# Patient Record
Sex: Female | Born: 1955 | Race: Black or African American | Hispanic: No | Marital: Single | Smoking: Former smoker
Health system: Southern US, Community
[De-identification: ages and names within clinical notes are randomized; demographics above are authoritative.]

## PROBLEM LIST (undated history)

## (undated) DIAGNOSIS — I639 Cerebral infarction, unspecified: Secondary | ICD-10-CM

## (undated) DIAGNOSIS — I10 Essential (primary) hypertension: Secondary | ICD-10-CM

## (undated) DIAGNOSIS — K219 Gastro-esophageal reflux disease without esophagitis: Secondary | ICD-10-CM

## (undated) HISTORY — DX: Cerebral infarction, unspecified: I63.9

## (undated) HISTORY — DX: Gastro-esophageal reflux disease without esophagitis: K21.9

## (undated) HISTORY — DX: Essential (primary) hypertension: I10

---

## 2002-01-16 HISTORY — PX: FOOT SURGERY: SHX648

## 2002-01-16 HISTORY — PX: SKIN GRAFT: SHX250

## 2004-02-22 ENCOUNTER — Ambulatory Visit: Payer: Self-pay

## 2004-04-09 ENCOUNTER — Emergency Department: Payer: Self-pay | Admitting: General Practice

## 2007-01-17 DIAGNOSIS — I639 Cerebral infarction, unspecified: Secondary | ICD-10-CM

## 2007-01-17 HISTORY — DX: Cerebral infarction, unspecified: I63.9

## 2008-03-28 ENCOUNTER — Inpatient Hospital Stay: Payer: Self-pay | Admitting: Internal Medicine

## 2008-05-01 ENCOUNTER — Emergency Department: Payer: Self-pay | Admitting: Emergency Medicine

## 2008-06-04 ENCOUNTER — Encounter: Payer: Self-pay | Admitting: Family Medicine

## 2008-06-16 ENCOUNTER — Encounter: Payer: Self-pay | Admitting: Family Medicine

## 2008-07-16 ENCOUNTER — Encounter: Payer: Self-pay | Admitting: Family Medicine

## 2008-07-24 ENCOUNTER — Emergency Department: Payer: Self-pay | Admitting: Emergency Medicine

## 2008-08-16 ENCOUNTER — Encounter: Payer: Self-pay | Admitting: Family Medicine

## 2008-09-16 ENCOUNTER — Encounter: Payer: Self-pay | Admitting: Family Medicine

## 2008-11-23 HISTORY — PX: BREAST BIOPSY: SHX20

## 2009-09-12 IMAGING — CT CT HEAD WITHOUT CONTRAST
3 series · 18 of 30 positions shown, 20 images · non-contrast
Comparison: none

REASON FOR EXAM: slurred speech
COMMENTS:

[Series 2: without · axial · non-contrast · 0.40mm/px · z∈[-165,-45]mm · 7 of 32 slices shown, 9 images (1 of 2)]
[im 4/32  brain]
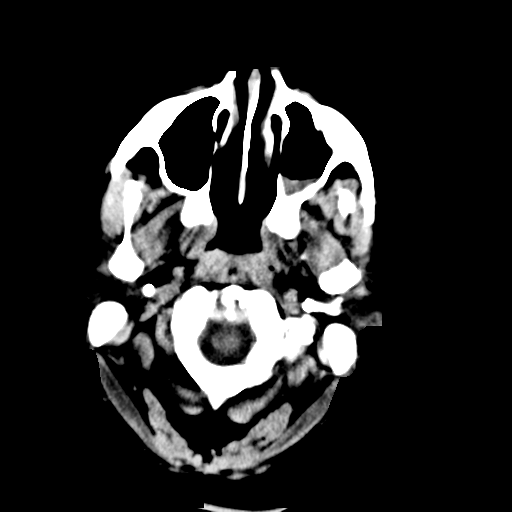
[im 4/32  bone]
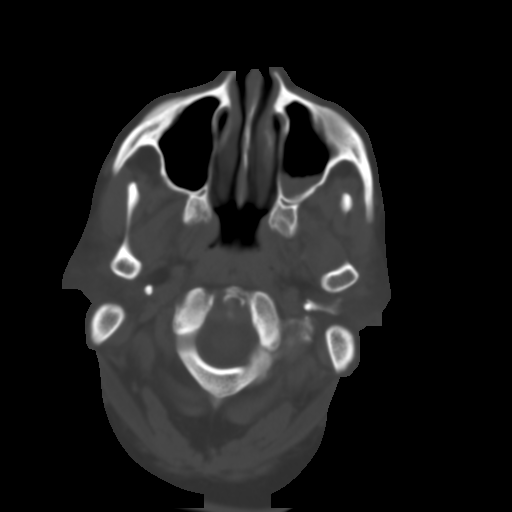
[im 8/32  brain]
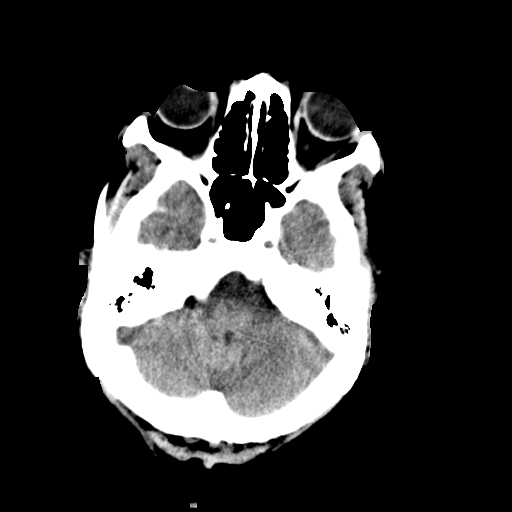
[im 12/32  brain]
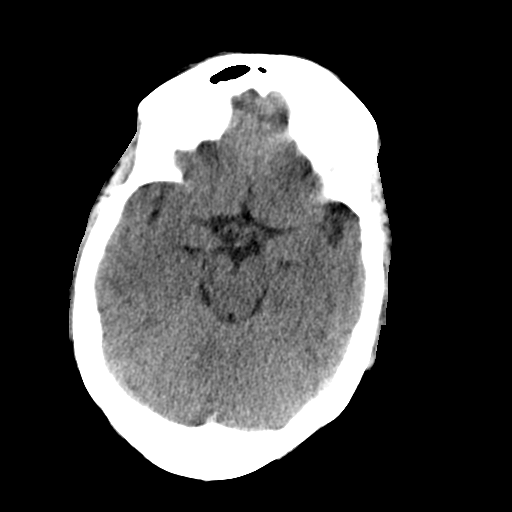
[im 16/32  brain]
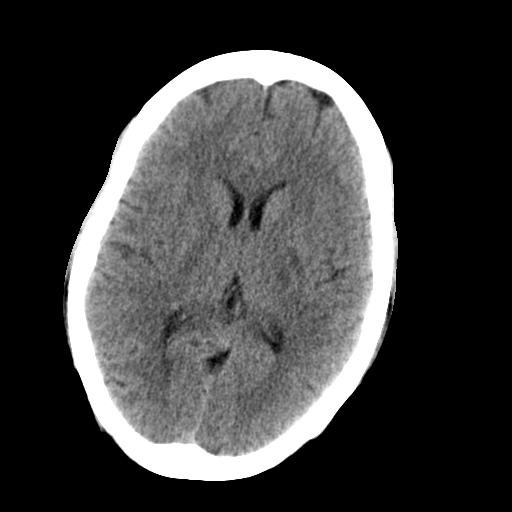
[im 20/32  brain]
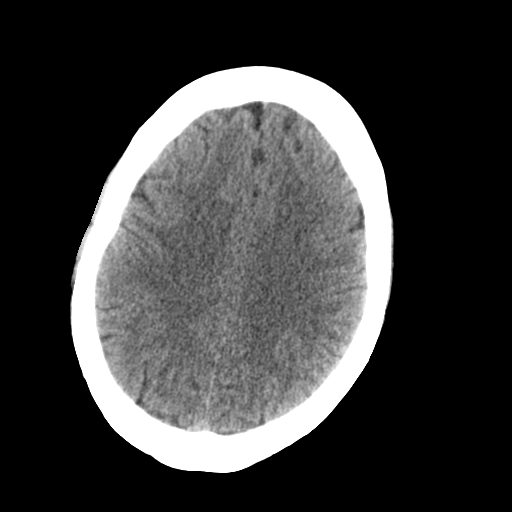
[im 20/32  bone]
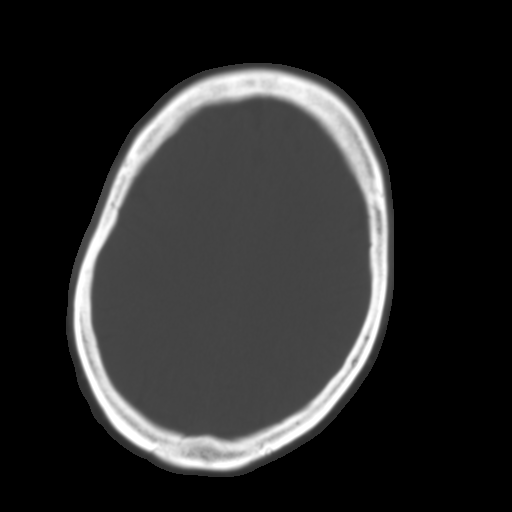
[im 24/32  brain]
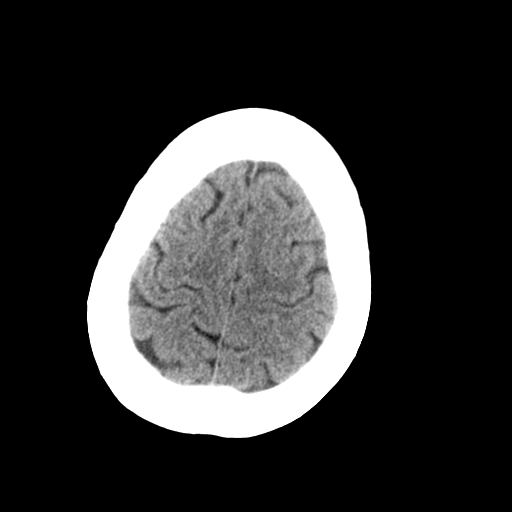
[im 28/32  brain]
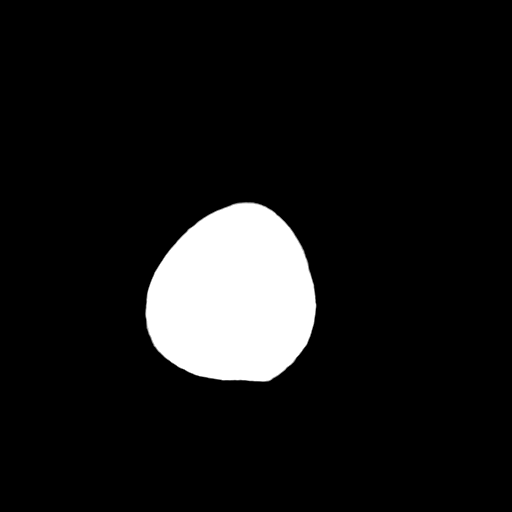

[Series 3: bone · axial · 0.40mm/px · z∈[-160,-115]mm · 3 of 32 slices shown]
[im 5/32  bone]
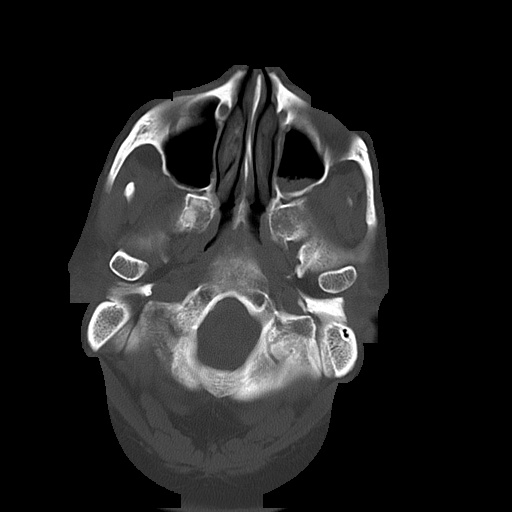
[im 9/32  bone]
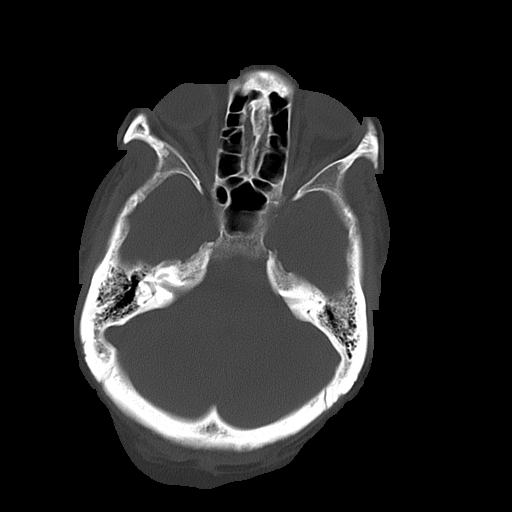
[im 14/32  bone]
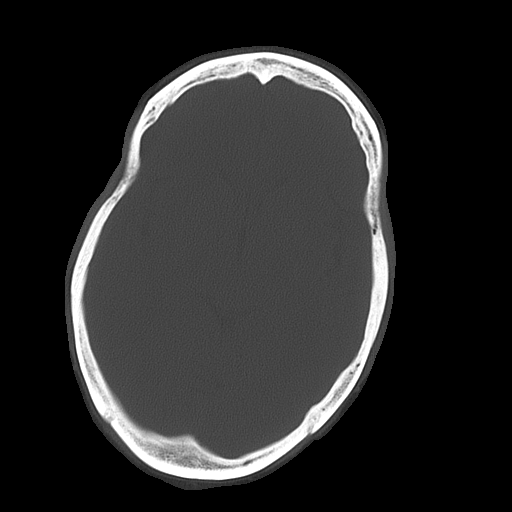

[Series 4: without · axial · non-contrast · 0.40mm/px · z∈[-154,-125]mm · 8 of 52 slices shown (2 of 2)]
[im 5/52  brain]
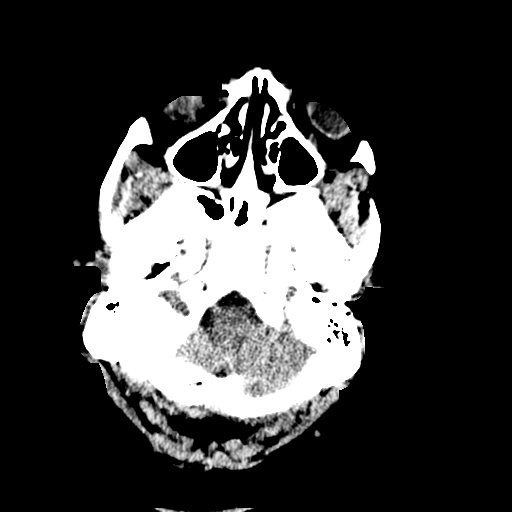
[im 13/52  brain]
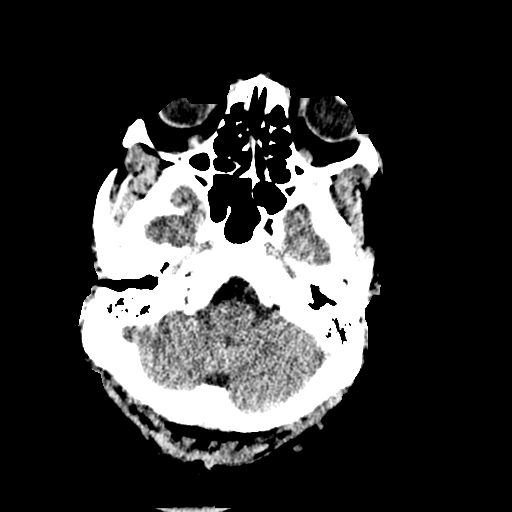
[im 18/52  brain]
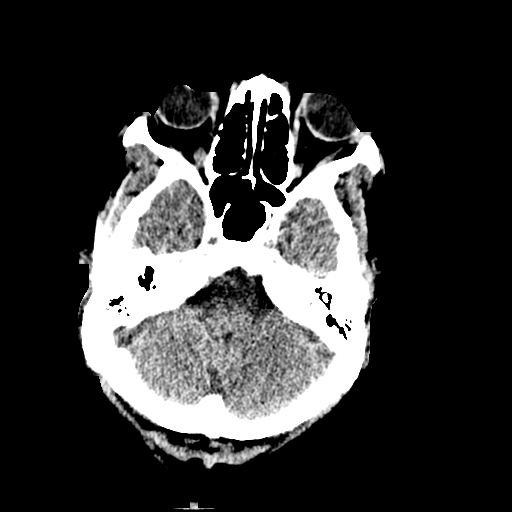
[im 22/52  brain]
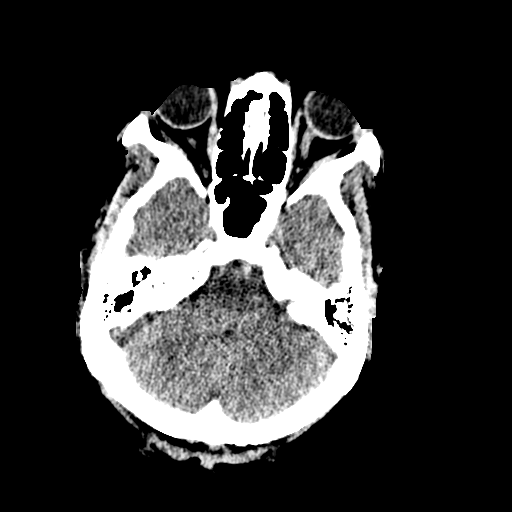
[im 30/52  brain]
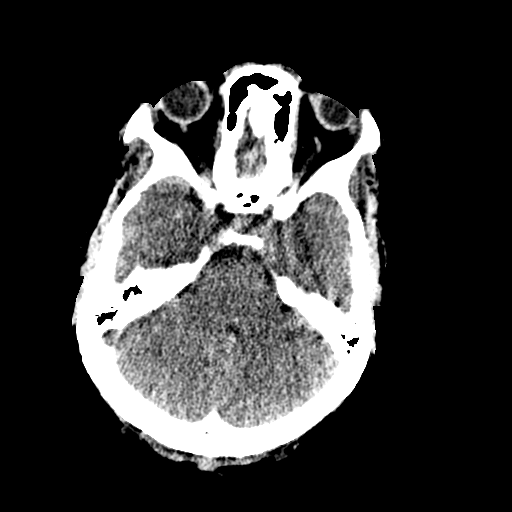
[im 35/52  brain]
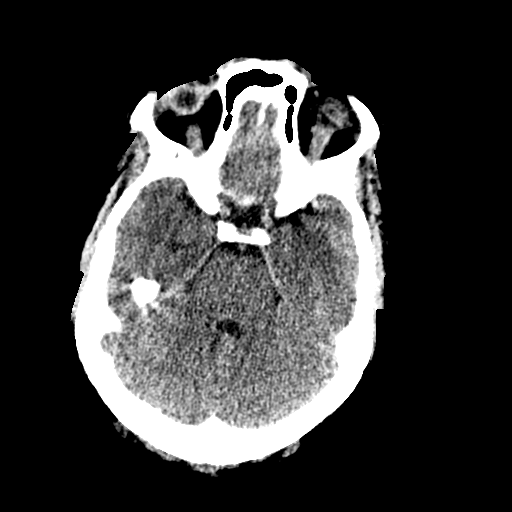
[im 39/52  brain]
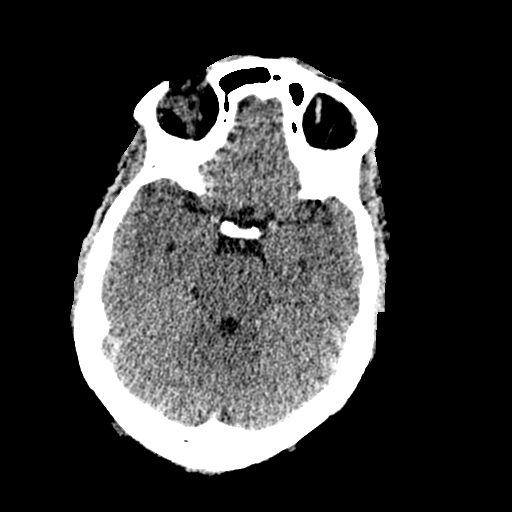
[im 47/52  brain]
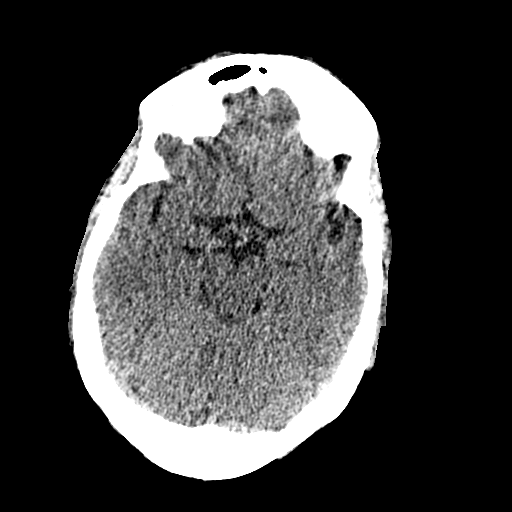

[18 of 30 positions shown; findings below may reference images not displayed]

PROCEDURE:     CT  - CT HEAD WITHOUT CONTRAST  - March 28, 2008  [DATE]

RESULT:     There is no evidence of intra-axial nor extra-axial fluid
collections. The visualized cranium demonstrates no evidence of fracture or
dislocation. An air fluid level is identified within the LEFT maxillary
sinus possibly representing an element of sinus disease.
IMPRESSION: 1. No evidence of acute intracranial abnormalities.

Dr. Engelbert X of the Emergency Department was informed of these findings at
the time of the initial interpretation.

## 2012-06-26 ENCOUNTER — Encounter: Payer: Self-pay | Admitting: General Surgery

## 2012-07-09 DIAGNOSIS — R920 Mammographic microcalcification found on diagnostic imaging of breast: Secondary | ICD-10-CM | POA: Insufficient documentation

## 2012-07-17 ENCOUNTER — Ambulatory Visit (INDEPENDENT_AMBULATORY_CARE_PROVIDER_SITE_OTHER): Payer: Medicare Other | Admitting: General Surgery

## 2012-07-17 ENCOUNTER — Encounter: Payer: Self-pay | Admitting: General Surgery

## 2012-07-17 VITALS — BP 130/72 | HR 76 | Resp 12 | Ht <= 58 in | Wt 183.0 lb

## 2012-07-17 DIAGNOSIS — R920 Mammographic microcalcification found on diagnostic imaging of breast: Secondary | ICD-10-CM

## 2012-07-17 NOTE — Patient Instructions (Addendum)
Patient to return in 6 months.

## 2012-07-17 NOTE — Progress Notes (Signed)
Patient ID: Leslie Nunez, female   DOB: March 14, 1955, 57 y.o.   MRN: [id number]  Chief Complaint  Patient presents with  . Follow-up    mammogram    HPI Leslie Nunez is a 57 y.o. female who presents for a breast evaluation. The most recent mammogram was done on 06/24/12 at [facility].Patient had an right breast stereo biopsy in 2010. Patient does perform regular self breast checks and does not get regular  mammograms done. Paternal Aunt history of breast cancer at passed at the age 33.    HPI  Past Medical History  Diagnosis Date  . Hypertension   . GERD (gastroesophageal reflux disease)   . Stroke 2009    Past Surgical History  Procedure Laterality Date  . Foot surgery Left 2004  . Skin graft Left 2004  . Breast biopsy Right 2010    stereo    Family History  Problem Relation Age of Onset  . Breast cancer Paternal Aunt     Social History History  Substance Use Topics  . Smoking status: Former Smoker -- 1.00 packs/day for 30 years  . Smokeless tobacco: Never Used  . Alcohol Use: No    Allergies  Allergen Reactions  . Morphine And Related     Current Outpatient Prescriptions  Medication Sig Dispense Refill  . amLODipine (NORVASC) 10 MG tablet Take 10 mg by mouth daily.      . hydrochlorothiazide (HYDRODIURIL) 25 MG tablet Take 25 mg by mouth daily.      . metoprolol tartrate (LOPRESSOR) 25 MG tablet Take 25 mg by mouth 2 (two) times daily.      . simvastatin (ZOCOR) 40 MG tablet Take 40 mg by mouth every evening.       No current facility-administered medications for this visit.    Review of Systems Review of Systems  Constitutional: Negative.   Cardiovascular: Negative.     Blood pressure 130/72, pulse 76, resp. rate 12, height 4\' 8"  (1.422 m), weight 183 lb (83.008 kg).  Physical Exam Physical Exam  Constitutional: She is oriented to person, place, and time. She appears well-developed and well-nourished.  Eyes: No scleral icterus.  Cardiovascular: Normal  rate, regular rhythm and normal heart sounds.   Pulmonary/Chest: Breath sounds normal. Right breast exhibits no inverted nipple, no mass, no nipple discharge, no skin change and no tenderness. Left breast exhibits no inverted nipple, no mass, no nipple discharge, no skin change and no tenderness.  Lymphadenopathy:    She has no cervical adenopathy.    She has no axillary adenopathy.  Neurological: She is alert and oriented to person, place, and time.  Skin: Skin is warm and dry.    Data Reviewed 06/24/2012 bilateral mammograms completed at [facility] showed a new small cluster of calcifications in the left breast. These had developed since her October 2010 exam. BI-RAD-4.  The previously biopsied right breast lesion is unchanged.    Assessment    New microcalcifications of the left breast.    Plan    There's been also for years since her interval mammogram was completed. There are 5 calcifications loosely clustered in the upper outer quadrant of the left breast. This is a low probability finding for DCIS without an associated mass.  The options for management were reviewed: 1) early stereotactic biopsy to establish a diagnosis of Versed 2) 6 month followup exam.  The patient at this time would like to complete a six-month followup. She was reminded that it will be very important to  have this exam completed, as she has missed followup mammograms in the past.       Earline Mayotte 07/17/2012, 11:00 AM

## 2013-01-01 ENCOUNTER — Encounter: Payer: Self-pay | Admitting: General Surgery

## 2013-01-13 ENCOUNTER — Ambulatory Visit: Payer: Medicare Other | Admitting: General Surgery

## 2013-01-23 ENCOUNTER — Ambulatory Visit: Payer: Medicare Other | Admitting: General Surgery

## 2013-03-11 ENCOUNTER — Ambulatory Visit: Payer: Medicare Other | Admitting: General Surgery

## 2013-03-24 ENCOUNTER — Encounter: Payer: Self-pay | Admitting: General Surgery

## 2013-03-24 ENCOUNTER — Ambulatory Visit (INDEPENDENT_AMBULATORY_CARE_PROVIDER_SITE_OTHER): Payer: Medicare Other | Admitting: General Surgery

## 2013-03-24 VITALS — BP 140/82 | HR 78 | Resp 14 | Ht <= 58 in | Wt 186.0 lb

## 2013-03-24 DIAGNOSIS — R920 Mammographic microcalcification found on diagnostic imaging of breast: Secondary | ICD-10-CM

## 2013-03-24 NOTE — Patient Instructions (Addendum)
Patient to return in three  months bilateral [facility] mamm[facility].

## 2013-03-24 NOTE — Progress Notes (Signed)
A aPatient ID: Leslie CorporalBrenda Nunez, female   DOB: 06-29-1955, 58 y.o.   MRN: [id number]  Chief Complaint  Patient presents with  . Follow-up    mammogram    HPI Leslie Nunez is a 58 y.o. female who presents for a breast evaluation. The most recent left breast mammogram was done on 01/01/13.Patient does perform regular self breast checks and gets regular mammograms done.    HPI  Past Medical History  Diagnosis Date  . Hypertension   . GERD (gastroesophageal reflux disease)   . Stroke 2009    Past Surgical History  Procedure Laterality Date  . Foot surgery Left 2004  . Skin graft Left 2004  . 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  apicall metaplasia. Focal sclerosing adenosis. Microcalcifications. No malignancy.    Family History  Problem Relation Age of Onset  . Breast cancer Paternal Aunt     Social History History  Substance Use Topics  . Smoking status: Former Smoker -- 1.00 packs/day for 30 years  . Smokeless tobacco: Never Used  . Alcohol Use: No    Allergies  Allergen Reactions  . Morphine And Related     Current Outpatient Prescriptions  Medication Sig Dispense Refill  . amLODipine (NORVASC) 10 MG tablet Take 10 mg by mouth daily.      . hydrochlorothiazide (HYDRODIURIL) 25 MG tablet Take 25 mg by mouth daily.      . metoprolol tartrate (LOPRESSOR) 25 MG tablet Take 25 mg by mouth 2 (two) times daily.      . simvastatin (ZOCOR) 40 MG tablet Take 40 mg by mouth every evening.       No current facility-administered medications for this visit.    Review of Systems Review of Systems  Constitutional: Negative.   Respiratory: Negative.   Cardiovascular: Negative.     Blood pressure 140/82, pulse 78, resp. rate 14, height 4\' 8"  (1.422 m), weight 186 lb (84.369 kg).  Physical Exam Physical Exam  Constitutional: She is oriented to person,  place, and time. She appears well-developed and well-nourished.  Eyes: Conjunctivae are normal.  Neck: Neck supple.  Cardiovascular: Normal rate, regular rhythm and normal heart sounds.   Pulmonary/Chest: Breath sounds normal. Right breast exhibits no inverted nipple, no mass, no nipple discharge, no skin change and no tenderness. Left breast exhibits no mass, no nipple discharge, no skin change and no tenderness.  Lymphadenopathy:    She has no cervical adenopathy.    She has no axillary adenopathy.  Neurological: She is alert and oriented to person, place, and time.  Skin: Skin is warm and dry.    Data Reviewed Left breast mammogram dated January 01, 2013 were reviewed and compared to previous teres. Microcalcification showed no interval change. BI-RAD-3.  Assessment    Benign breast exam. Stable microcalcifications.     Plan    The patient had a very delayed followup after her December mammograms. Bilateral mammograms would typically be due in April. Will push these back to June, and arrangements will be made through this office to obtain bilateral diagnostic mammograms at that time to clear the area of calcifications. Assuming no interval change, she'll be able to resume annual screening exams with her PCP in 2016.     Earline MayotteByrnett, Jeffrey W 03/24/2013, 8:15 PM

## 2013-07-08 ENCOUNTER — Encounter: Payer: Self-pay | Admitting: General Surgery

## 2013-07-17 ENCOUNTER — Encounter: Payer: Self-pay | Admitting: General Surgery

## 2013-07-17 ENCOUNTER — Ambulatory Visit (INDEPENDENT_AMBULATORY_CARE_PROVIDER_SITE_OTHER): Payer: Medicare Other | Admitting: General Surgery

## 2013-07-17 VITALS — BP 136/92 | HR 78 | Resp 13 | Ht <= 58 in | Wt 187.0 lb

## 2013-07-17 DIAGNOSIS — R920 Mammographic microcalcification found on diagnostic imaging of breast: Secondary | ICD-10-CM

## 2013-07-17 NOTE — Progress Notes (Signed)
Patient ID: Leslie Nunez, female   DOB: 11/01/1955, 58 y.o.   MRN: [id number]  Chief Complaint  Patient presents with  . Follow-up    mammogram    HPI Leslie Nunez is a 58 y.o. female who presents for a breast evaluation. [facility].Patient does perform regular self breast checks and gets regular mammograms done. She reports no new breast problems and she is doing well. She does report some bilateral ankle pain and swelling.   HPI  Past Medical History  Diagnosis Date  . Hypertension   . GERD (gastroesophageal reflux disease)   . Stroke 2009    Past Surgical History  Procedure Laterality Date  . Foot surgery Left 2004  . Skin graft Left 2004  . 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  apicall metaplasia. Focal sclerosing adenosis. Microcalcifications. No malignancy.    Family History  Problem Relation Age of Onset  . Breast cancer Paternal Aunt   . Prostate cancer Father     Social History History  Substance Use Topics  . Smoking status: Former Smoker -- 1.00 packs/day for 30 years  . Smokeless tobacco: Never Used  . Alcohol Use: No    Allergies  Allergen Reactions  . Morphine And Related     Current Outpatient Prescriptions  Medication Sig Dispense Refill  . amLODipine (NORVASC) 10 MG tablet Take 10 mg by mouth daily.      . hydrochlorothiazide (HYDRODIURIL) 25 MG tablet Take 25 mg by mouth daily.      . metoprolol tartrate (LOPRESSOR) 25 MG tablet Take 25 mg by mouth 2 (two) times daily.      . simvastatin (ZOCOR) 40 MG tablet Take 40 mg by mouth every evening.       No current facility-administered medications for this visit.    Review of Systems Review of Systems  Constitutional: Negative.   Respiratory: Negative.   Cardiovascular: Negative.     Blood pressure 136/92, pulse 78, resp.  rate 13, height 4\' 8"  (1.422 m), weight 187 lb (84.823 kg).  Physical Exam Physical Exam  Constitutional: She is oriented to person, place, and time. She appears well-developed and well-nourished.  Neck: Neck supple.  Cardiovascular: Normal rate, regular rhythm and normal heart sounds.   Pulses:      Dorsalis pedis pulses are 2+ on the right side.       Posterior tibial pulses are 2+ on the right side.  Pulmonary/Chest: Effort normal and breath sounds normal. Right breast exhibits no inverted nipple, no mass, no nipple discharge, no skin change and no tenderness. Left breast exhibits no inverted nipple, no mass, no nipple discharge, no skin change and no tenderness.  Musculoskeletal:       Feet:  Lymphadenopathy:    She has no cervical adenopathy.    She has no axillary adenopathy.  Neurological: She is alert and oriented to person, place, and time.  Skin:  3x6 cm soft tissue mass on the lateral aspect of the right ankle    Data Reviewed Diagnostic mammograms completed at [facility] dated July 08, 2013 showed no interval change in the previous identified calcifications. BI-RAD-2.  Assessment    Benign breast exam.  Right ankle lipoma.     Plan    The patient should resume annual screening mammograms with her primary care provider in summer 2016.  The patient  should discuss with her primary care provider with her orthopedic assessment of the right ankle pain is warranted.     PCP: Bearl Mulberryunn, Paul F    Amaka Gluth W 07/17/2013, 9:32 PM

## 2013-07-17 NOTE — Patient Instructions (Addendum)
Continue self breast exams. Call office for any new breast issues or concerns. 

## 2013-11-17 ENCOUNTER — Encounter: Payer: Self-pay | Admitting: General Surgery

## 2019-09-15 ENCOUNTER — Ambulatory Visit: Payer: Self-pay | Attending: Internal Medicine

## 2019-09-15 DIAGNOSIS — Z23 Encounter for immunization: Secondary | ICD-10-CM

## 2019-09-15 NOTE — Progress Notes (Signed)
   Covid-19 Vaccination Clinic  Name:  Leslie Nunez    MRN: [id number] DOB: [birth year]  09/15/2019  Leslie Nunez was observed post Covid-19 immunization for 15 minutes without incident. She was provided with Vaccine Information Sheet and instruction to access the V-Safe system.   Leslie Nunez was instructed to call 911 with any severe reactions post vaccine: Marland Kitchen Difficulty breathing  . Swelling of face and throat  . A fast heartbeat  . A bad rash all over body  . Dizziness and weakness   Immunizations Administered    Name Date Dose VIS Date Route   Pfizer COVID-19 Vaccine 09/15/2019  9:19 AM 0.3 mL 03/12/2018 Intramuscular   Manufacturer: [employer], [employer]   Lot: [id number]   NDC: [address]-0

## 2019-10-06 ENCOUNTER — Ambulatory Visit: Payer: Self-pay | Attending: Internal Medicine

## 2019-10-06 DIAGNOSIS — Z23 Encounter for immunization: Secondary | ICD-10-CM

## 2019-10-06 NOTE — Progress Notes (Signed)
   Covid-19 Vaccination Clinic  Name:  Leslie Nunez    MRN: [id number] DOB: 1955-01-26  10/06/2019  [name] was observed post Covid-19 immunization for 15 minutes without incident. She was provided with Vaccine Information Sheet and instruction to access the V-Safe system.   [name] was instructed to call 911 with any severe reactions post vaccine: Marland Kitchen Difficulty breathing  . Swelling of face and throat  . A fast heartbeat  . A bad rash all over body  . Dizziness and weakness   Immunizations Administered    Name Date Dose VIS Date Route   Pfizer COVID-19 Vaccine 10/06/2019  9:41 AM 0.3 mL 03/12/2018 Intramuscular   Manufacturer: [employer], [employer]   Lot: [id number]   NDC: [address]-1

## 2022-03-10 ENCOUNTER — Other Ambulatory Visit: Payer: Self-pay | Admitting: Family Medicine

## 2022-03-10 DIAGNOSIS — Z1231 Encounter for screening mammogram for malignant neoplasm of breast: Secondary | ICD-10-CM

## 2022-04-18 ENCOUNTER — Inpatient Hospital Stay
Admission: RE | Admit: 2022-04-18 | Discharge: 2022-04-18 | Disposition: A | Discharge status: Home / Self Care | Payer: Self-pay | Source: Ambulatory Visit | Attending: Family Medicine | Admitting: Family Medicine

## 2022-04-18 ENCOUNTER — Ambulatory Visit
Admission: RE | Admit: 2022-04-18 | Discharge: 2022-04-18 | Disposition: A | Discharge status: Home / Self Care | Payer: Medicare Other | Source: Ambulatory Visit | Attending: Family Medicine | Admitting: Family Medicine

## 2022-04-18 ENCOUNTER — Other Ambulatory Visit: Payer: Self-pay | Admitting: *Deleted

## 2022-04-18 DIAGNOSIS — Z1231 Encounter for screening mammogram for malignant neoplasm of breast: Secondary | ICD-10-CM

## 2023-02-16 ENCOUNTER — Other Ambulatory Visit: Payer: Self-pay | Admitting: Family Medicine

## 2023-02-16 DIAGNOSIS — M810 Age-related osteoporosis without current pathological fracture: Secondary | ICD-10-CM

## 2023-03-15 ENCOUNTER — Other Ambulatory Visit: Payer: Self-pay | Admitting: Family Medicine

## 2023-03-15 DIAGNOSIS — Z1231 Encounter for screening mammogram for malignant neoplasm of breast: Secondary | ICD-10-CM

## 2023-04-26 ENCOUNTER — Ambulatory Visit
Admission: RE | Admit: 2023-04-26 | Discharge: 2023-04-26 | Disposition: A | Discharge status: Home / Self Care | Payer: Medicare Other | Source: Ambulatory Visit | Attending: Family Medicine | Admitting: Family Medicine

## 2023-04-26 DIAGNOSIS — M810 Age-related osteoporosis without current pathological fracture: Secondary | ICD-10-CM | POA: Diagnosis present

## 2023-04-26 DIAGNOSIS — Z1231 Encounter for screening mammogram for malignant neoplasm of breast: Secondary | ICD-10-CM | POA: Diagnosis present
